# Patient Record
Sex: Female | Born: 1975 | Race: White | Hispanic: No | Marital: Single | Smoking: Never smoker
Health system: Southern US, Community
[De-identification: ages and names within clinical notes are randomized; demographics above are authoritative.]

## PROBLEM LIST (undated history)

## (undated) DIAGNOSIS — I10 Essential (primary) hypertension: Secondary | ICD-10-CM

## (undated) DIAGNOSIS — E079 Disorder of thyroid, unspecified: Secondary | ICD-10-CM

---

## 2011-11-23 ENCOUNTER — Other Ambulatory Visit: Payer: Self-pay | Admitting: Family Medicine

## 2011-11-23 DIAGNOSIS — R109 Unspecified abdominal pain: Secondary | ICD-10-CM

## 2011-11-24 ENCOUNTER — Ambulatory Visit
Admission: RE | Admit: 2011-11-24 | Discharge: 2011-11-24 | Disposition: A | Discharge status: Home / Self Care | Payer: No Typology Code available for payment source | Source: Ambulatory Visit | Attending: Family Medicine | Admitting: Family Medicine

## 2011-11-24 DIAGNOSIS — R109 Unspecified abdominal pain: Secondary | ICD-10-CM

## 2012-09-11 ENCOUNTER — Ambulatory Visit: Payer: Self-pay

## 2013-09-11 ENCOUNTER — Emergency Department (HOSPITAL_COMMUNITY)
Admission: EM | Admit: 2013-09-11 | Discharge: 2013-09-11 | Disposition: A | Discharge status: Home / Self Care | Payer: BC Managed Care – PPO | Attending: Emergency Medicine | Admitting: Emergency Medicine

## 2013-09-11 ENCOUNTER — Emergency Department (HOSPITAL_COMMUNITY): Payer: BC Managed Care – PPO

## 2013-09-11 ENCOUNTER — Encounter (HOSPITAL_COMMUNITY): Payer: Self-pay | Admitting: Emergency Medicine

## 2013-09-11 DIAGNOSIS — Z7982 Long term (current) use of aspirin: Secondary | ICD-10-CM | POA: Insufficient documentation

## 2013-09-11 DIAGNOSIS — S161XXA Strain of muscle, fascia and tendon at neck level, initial encounter: Secondary | ICD-10-CM

## 2013-09-11 DIAGNOSIS — S139XXA Sprain of joints and ligaments of unspecified parts of neck, initial encounter: Secondary | ICD-10-CM | POA: Insufficient documentation

## 2013-09-11 DIAGNOSIS — W1692XA Jumping or diving into unspecified water causing other injury, initial encounter: Secondary | ICD-10-CM | POA: Insufficient documentation

## 2013-09-11 DIAGNOSIS — Z79899 Other long term (current) drug therapy: Secondary | ICD-10-CM | POA: Insufficient documentation

## 2013-09-11 DIAGNOSIS — I10 Essential (primary) hypertension: Secondary | ICD-10-CM | POA: Insufficient documentation

## 2013-09-11 DIAGNOSIS — S060X9A Concussion with loss of consciousness of unspecified duration, initial encounter: Secondary | ICD-10-CM | POA: Insufficient documentation

## 2013-09-11 DIAGNOSIS — S0990XA Unspecified injury of head, initial encounter: Secondary | ICD-10-CM

## 2013-09-11 DIAGNOSIS — S060XAA Concussion with loss of consciousness status unknown, initial encounter: Secondary | ICD-10-CM | POA: Insufficient documentation

## 2013-09-11 DIAGNOSIS — Y9289 Other specified places as the place of occurrence of the external cause: Secondary | ICD-10-CM | POA: Insufficient documentation

## 2013-09-11 DIAGNOSIS — E079 Disorder of thyroid, unspecified: Secondary | ICD-10-CM | POA: Insufficient documentation

## 2013-09-11 DIAGNOSIS — Y9339 Activity, other involving climbing, rappelling and jumping off: Secondary | ICD-10-CM | POA: Insufficient documentation

## 2013-09-11 HISTORY — DX: Disorder of thyroid, unspecified: E07.9

## 2013-09-11 HISTORY — DX: Essential (primary) hypertension: I10

## 2013-09-11 MED ORDER — OXYCODONE-ACETAMINOPHEN 5-325 MG PO TABS: 1.0000 | ORAL_TABLET | ORAL | Status: AC | PRN

## 2013-09-11 MED ORDER — KETOROLAC TROMETHAMINE 60 MG/2ML IM SOLN
60.0000 mg | Freq: Once | INTRAMUSCULAR | Status: AC
  Administered 2013-09-11: 60 mg via INTRAMUSCULAR
  Filled 2013-09-11: qty 2

## 2013-09-11 MED ORDER — CYCLOBENZAPRINE HCL 10 MG PO TABS: 10.0000 mg | ORAL_TABLET | Freq: Three times a day (TID) | ORAL | Status: AC | PRN

## 2013-09-11 NOTE — Discharge Instructions (Signed)
Concussion, Adult °A concussion, or closed-head injury, is a brain injury caused by a direct blow to the head or by a quick and sudden movement (jolt) of the head or neck. Concussions are usually not life-threatening. Even so, the effects of a concussion can be serious. If you have had a concussion before, you are more likely to experience concussion-like symptoms after a direct blow to the head.  °CAUSES  °· Direct blow to the head, such as from running into another player during a soccer game, being hit in a fight, or hitting your head on a hard surface. °· A jolt of the head or neck that causes the brain to move back and forth inside the skull, such as in a car crash. °SIGNS AND SYMPTOMS  °The signs of a concussion can be hard to notice. Early on, they may be missed by you, family members, and health care providers. You may look fine but act or feel differently. °Symptoms are usually temporary, but they may last for days, weeks, or even longer. Some symptoms may appear right away while others may not show up for hours or days. Every head injury is different. Symptoms include:  °· Mild to moderate headaches that will not go away. °· A feeling of pressure inside your head.  °· Having more trouble than usual:   °· Learning or remembering things you have heard. °· Answering questions.  °· Paying attention or concentrating.   °· Organizing daily tasks.   °· Making decisions and solving problems.   °· Slowness in thinking, acting or reacting, speaking, or reading.   °· Getting lost or being easily confused.   °· Feeling tired all the time or lacking energy (fatigued).   °· Feeling drowsy.   °· Sleep disturbances.   °· Sleeping more than usual.   °· Sleeping less than usual.   °· Trouble falling asleep.   °· Trouble sleeping (insomnia).   °· Loss of balance or feeling lightheaded or dizzy.   °· Nausea or vomiting.   °· Numbness or tingling.   °· Increased sensitivity to:   °· Sounds.   °· Lights.   °· Distractions.    °· Vision problems or eyes that tire easily.   °· Diminished sense of taste or smell.   °· Ringing in the ears.   °· Mood changes such as feeling sad or anxious.   °· Becoming easily irritated or angry for little or no reason.   °· Lack of motivation. °· Seeing or hearing things other people do not see or hear (hallucinations). °DIAGNOSIS  °Your health care provider can usually diagnose a concussion based on a description of your injury and symptoms. He or she will ask whether you passed out (lost consciousness) and whether you are having trouble remembering events that happened right before and during your injury.  °Your evaluation might include:  °· A brain scan to look for signs of injury to the brain. Even if the test shows no injury, you may still have a concussion.   °· Blood tests to be sure other problems are not present. °TREATMENT  °· Concussions are usually treated in an emergency department, in urgent care, or at a clinic. You may need to stay in the hospital overnight for further treatment.   °· Tell your health care provider if you are taking any medicines, including prescription medicines, over-the-counter medicines, and natural remedies. Some medicines, such as blood thinners (anticoagulants) and aspirin, may increase the chance of complications. Also tell your health care provider whether you have had alcohol or are taking illegal drugs. This information may affect treatment. °· Your health care provider will send you   home with important instructions to follow. °· How fast you will recover from a concussion depends on many factors. These factors include how severe your concussion is, what part of your brain was injured, your age, and how healthy you were before the concussion. °· Most people with mild injuries recover fully. Recovery can take time. In general, recovery is slower in older persons. Also, persons who have had a concussion in the past or have other medical problems may find that it  takes longer to recover from their current injury. °HOME CARE INSTRUCTIONS  °General Instructions °· Carefully follow the directions your health care provider gave you. °· Only take over-the-counter or prescription medicines for pain, discomfort, or fever as directed by your health care provider. °· Take only those medicines that your health care provider has approved. °· Do not drink alcohol until your health care provider says you are well enough to do so. Alcohol and certain other drugs may slow your recovery and can put you at risk of further injury. °· If it is harder than usual to remember things, write them down. °· If you are easily distracted, try to do one thing at a time. For example, do not try to watch TV while fixing dinner. °· Talk with family members or close friends when making important decisions. °· Keep all follow-up appointments. Repeated evaluation of your symptoms is recommended for your recovery. °· Watch your symptoms and tell others to do the same. Complications sometimes occur after a concussion. Older adults with a brain injury may have a higher risk of serious complications such as of a blood clot on the brain. °· Tell your teachers, school nurse, school counselor, coach, athletic trainer, or work manager about your injury, symptoms, and restrictions. Tell them about what you can or cannot do. They should watch for:   °· Increased problems with attention or concentration.   °· Increased difficulty remembering or learning [place] information.   °· Increased time needed to complete tasks or assignments.   °· Increased irritability or decreased ability to cope with stress.   °· Increased symptoms.   °· Rest. Rest helps the brain to heal. Make sure you: °· Get plenty of sleep at night. Avoid staying up late at night. °· Keep the same bedtime hours on weekends and weekdays. °· Rest during the day. Take daytime naps or rest breaks when you feel tired. °· Limit activities that require a lot of  thought or concentration. These includes   °· Doing homework or job-related work.   °· Watching TV.   °· Working on the computer. °· Avoid any situation where there is potential for another head injury (football, hockey, soccer, basketball, martial arts, downhill snow sports and horseback riding). Your condition will get worse every time you experience a concussion. You should avoid these activities until you are evaluated by the appropriate follow-up caregivers. °Returning To Your Regular Activities °You will need to return to your normal activities slowly, not all at once. You must give your body and brain enough time for recovery. °· Do not return to sports or other athletic activities until your health care provider tells you it is safe to do so. °· Ask your health care provider when you can drive, ride a bicycle, or operate heavy machinery. Your ability to react may be slower after a brain injury. Never do these activities if you are dizzy. °· Ask your health care provider about when you can return to work or school. °Preventing Another Concussion °It is very important to avoid another   brain injury, especially before you have recovered. In rare cases, another injury can lead to permanent brain damage, brain swelling, or death. The risk of this is greatest during the first 7 10 days after a head injury. Avoid injuries by:   Wearing a seat belt when riding in a car.   Drinking alcohol only in moderation.   Wearing a helmet when biking, skiing, skateboarding, skating, or doing similar activities.  Avoiding activities that could lead to a second concussion, such as contact or recreational sports, until your health care provider says it is OK.  Taking safety measures in your home.   Remove clutter and tripping hazards from floors and stairways.   Use grab bars in bathrooms and handrails by stairs.   Place non-slip mats on floors and in bathtubs.   Improve lighting in dim areas. SEEK MEDICAL  CARE IF:   You have increased problems paying attention or concentrating.   You have increased difficulty remembering or learning [place] information.   You need more time to complete tasks or assignments than before.   You have increased irritability or decreased ability to cope with stress.  You have more symptoms than before. Seek medical care if you have any of the following symptoms for more than 2 weeks after your injury:   Lasting (chronic) headaches.   Dizziness or balance problems.   Nausea.  Vision problems.   Increased sensitivity to noise or light.   Depression or mood swings.   Anxiety or irritability.   Memory problems.   Difficulty concentrating or paying attention.   Sleep problems.   Feeling tired all the time. SEEK IMMEDIATE MEDICAL CARE IF:   You have severe or worsening headaches. These may be a sign of a blood clot in the brain.  You have weakness (even if only in one hand, leg, or part of the face).  You have numbness.  You have decreased coordination.   You vomit repeatedly.  You have increased sleepiness.  One pupil is larger than the other.   You have convulsions.   You have slurred speech.   You have increased confusion. This may be a sign of a blood clot in the brain.  You have increased restlessness, agitation, or irritability.   You are unable to recognize people or places.   You have neck pain.   It is difficult to wake you up.   You have unusual behavior changes.   You lose consciousness. MAKE SURE YOU:   Understand these instructions.  Will watch your condition.  Will get help right away if you are not doing well or get worse. Document Released: 06/19/2003 Document Revised: 11/29/2012 Document Reviewed: 10/19/2012 [facility] Patient Information 2014 [place], [place].  Cervical Sprain A cervical sprain is an injury in the neck in which the strong, fibrous tissues (ligaments) that connect your  neck bones stretch or tear. Cervical sprains can range from mild to severe. Severe cervical sprains can cause the neck vertebrae to be unstable. This can lead to damage of the spinal cord and can result in serious nervous system problems. The amount of time it takes for a cervical sprain to get better depends on the cause and extent of the injury. Most cervical sprains heal in 1 to 3 weeks. CAUSES  Severe cervical sprains may be caused by:   Contact sport injuries (such as from football, rugby, wrestling, hockey, auto racing, gymnastics, diving, martial arts, or boxing).   Motor vehicle collisions.   Whiplash injuries. This is  an injury from a sudden forward-and backward whipping movement of the head and neck.  Falls.  Mild cervical sprains may be caused by:   Being in an awkward position, such as while cradling a telephone between your ear and shoulder.   Sitting in a chair that does not offer proper support.   Working at a poorly [employer] executivedesigned computer station.   Looking up or down for long periods of time.  SYMPTOMS   Pain, soreness, stiffness, or a burning sensation in the front, back, or sides of the neck. This discomfort may develop immediately after the injury or slowly, 24 hours or more after the injury.   Pain or tenderness directly in the middle of the back of the neck.   Shoulder or upper back pain.   Limited ability to move the neck.   Headache.   Dizziness.   Weakness, numbness, or tingling in the hands or arms.   Muscle spasms.   Difficulty swallowing or chewing.   Tenderness and swelling of the neck.  DIAGNOSIS  Most of the time your health care provider can diagnose a cervical sprain by taking your history and doing a physical exam. Your health care provider will ask about previous neck injuries and any known neck problems, such as arthritis in the neck. X-rays may be taken to find out if there are any other problems, such as with the bones of  the neck. Other tests, such as a CT scan or MRI, may also be needed.  TREATMENT  Treatment depends on the severity of the cervical sprain. Mild sprains can be treated with rest, keeping the neck in place (immobilization), and pain medicines. Severe cervical sprains are immediately immobilized. Further treatment is done to help with pain, muscle spasms, and other symptoms and may include:  Medicines, such as pain relievers, numbing medicines, or muscle relaxants.   Physical therapy. This may involve stretching exercises, strengthening exercises, and posture training. Exercises and improved posture can help stabilize the neck, strengthen muscles, and help stop symptoms from returning.  HOME CARE INSTRUCTIONS   Put ice on the injured area.   Put ice in a plastic bag.   Place a towel between your skin and the bag.   Leave the ice on for 15 20 minutes, 3 4 times a day.   If your injury was severe, you may have been given a cervical collar to wear. A cervical collar is a two-piece collar designed to keep your neck from moving while it heals.  Do not remove the collar unless instructed by your health care provider.  If you have long hair, keep it outside of the collar.  Ask your health care provider before making any adjustments to your collar. Minor adjustments may be required over time to improve comfort and reduce pressure on your chin or on the back of your head.  Ifyou are allowed to remove the collar for cleaning or bathing, follow your health care provider's instructions on how to do so safely.  Keep your collar clean by wiping it with mild soap and water and drying it completely. If the collar you have been given includes removable pads, remove them every 1 2 days and hand wash them with soap and water. Allow them to air dry. They should be completely dry before you wear them in the collar.  If you are allowed to remove the collar for cleaning and bathing, wash and dry the skin  of your neck. Check your skin for irritation or  sores. If you see any, tell your health care provider.  Do not drive while wearing the collar.   Only take over-the-counter or prescription medicines for pain, discomfort, or fever as directed by your health care provider.   Keep all follow-up appointments as directed by your health care provider.   Keep all physical therapy appointments as directed by your health care provider.   Make any needed adjustments to your workstation to promote good posture.   Avoid positions and activities that make your symptoms worse.   Warm up and stretch before being active to help prevent problems.  SEEK MEDICAL CARE IF:   Your pain is not controlled with medicine.   You are unable to decrease your pain medicine over time as planned.   Your activity level is not improving as expected.  SEEK IMMEDIATE MEDICAL CARE IF:   You develop any bleeding.  You develop stomach upset.  You have signs of an allergic reaction to your medicine.   Your symptoms get worse.   You develop Fennel, unexplained symptoms.   You have numbness, tingling, weakness, or paralysis in any part of your body.  MAKE SURE YOU:   Understand these instructions.  Will watch your condition.  Will get help right away if you are not doing well or get worse. Document Released: 01/24/2007 Document Revised: 01/17/2013 Document Reviewed: 10/04/2012 [facility] Patient Information 2014 [place], [place].

## 2013-09-11 NOTE — ED Notes (Signed)
Pt reports going to the pool this weekend and did a back flip in the shallow end, hit her head on the bottom of pool. Now has pain, bruising, dizziness, nausea. No acute distress noted at triage.

## 2013-09-11 NOTE — ED Provider Notes (Signed)
CSN: [id number]     Arrival date & time 09/11/13  1315 History   First MD Initiated Contact with Patient 09/11/13 1519     Chief Complaint  Patient presents with  . Head Injury     (Consider location/radiation/quality/duration/timing/severity/associated sxs/prior Treatment) HPI Comments: Patient presents to the ER for evaluation of head injury. Patient reports she jumped into a pool 3 days ago hit her head on the bottom of the pool. Since then she has been having a constant headache across the top of her head. Pain is throbbing and moderate to severe. She reports associated dizziness and nausea. She says it feels like a "migraine". She has had migraines infrequently in the past. There is no visual disturbance. She also complains of pain on the left side of her neck. No radiation into the arms. No upper extremity numbness, tingling or weakness.  Patient is a 38 y.o. female presenting with head injury.  Head Injury Associated symptoms: headache and neck pain     Past Medical History  Diagnosis Date  . Hypertension   . Thyroid disease    History reviewed. No pertinent past surgical history. History reviewed. No pertinent family history. History  Substance Use Topics  . Smoking status: Not on file  . Smokeless tobacco: Not on file  . Alcohol Use: No   OB History   Grav Para Term Preterm Abortions TAB SAB Ect Mult Living                 Review of Systems  Musculoskeletal: Positive for neck pain.  Neurological: Positive for headaches.  All other systems reviewed and are negative.     Allergies  Bee venom; Codeine; and Other  Home Medications   Prior to Admission medications   Medication Sig Start Date End Date Taking? Authorizing Provider  aspirin 81 MG tablet Take 81 mg by mouth daily.   Yes Historical Provider, MD  metoprolol succinate (TOPROL-XL) 50 MG 24 hr tablet Take 50 mg by mouth daily. Take with or immediately following a meal.   Yes Historical Provider, MD    thyroid (ARMOUR) 30 MG tablet Take 30 mg by mouth daily before breakfast.   Yes Historical Provider, MD  cyclobenzaprine (FLEXERIL) 10 MG tablet Take 1 tablet (10 mg total) by mouth 3 (three) times daily as needed for muscle spasms. 09/11/13   Gilda Creasehristopher J. Pollina, MD  oxyCODONE-acetaminophen (PERCOCET) 5-325 MG per tablet Take 1-2 tablets by mouth every 4 (four) hours as needed. 09/11/13   Gilda Creasehristopher J. Pollina, MD   BP 149/73  Pulse 64  Temp(Src) 97.9 F (36.6 C) (Oral)  Resp 10  Wt 190 lb 8 oz (86.41 kg)  SpO2 100%  LMP 08/25/2013 Physical Exam  Constitutional: She is oriented to person, place, and time. She appears well-developed and well-nourished. No distress.  HENT:  Head: Normocephalic and atraumatic.  Right Ear: Hearing normal.  Left Ear: Hearing normal.  Nose: Nose normal.  Mouth/Throat: Oropharynx is clear and moist and mucous membranes are normal.  Eyes: Conjunctivae and EOM are normal. Pupils are equal, round, and reactive to light.  Neck: Normal range of motion. Neck supple. Muscular tenderness present.    Cardiovascular: Regular rhythm, S1 normal and S2 normal.  Exam reveals no gallop and no friction rub.   No murmur heard. Pulmonary/Chest: Effort normal and breath sounds normal. No respiratory distress. She exhibits no tenderness.  Abdominal: Soft. Normal appearance and bowel sounds are normal. There is no hepatosplenomegaly. There is no tenderness.  There is no rebound, no guarding, no tenderness at McBurney's point and negative Murphy's sign. No hernia.  Musculoskeletal: Normal range of motion.       Thoracic back: Normal.       Lumbar back: Normal.  Neurological: She is alert and oriented to person, place, and time. She has normal strength and normal reflexes. No cranial nerve deficit or sensory deficit. Coordination normal. GCS eye subscore is 4. GCS verbal subscore is 5. GCS motor subscore is 6.  Skin: Skin is warm, dry and intact. No rash noted. No cyanosis.   Psychiatric: She has a normal mood and affect. Her speech is normal and behavior is normal. Thought content normal.    ED Course  Procedures (including critical care time) Labs Review Labs Reviewed - No data to display  Imaging Review Ct Head Wo Contrast  09/11/2013   CLINICAL DATA:  Trauma this weekend.  Head and neck pain.  EXAM: CT HEAD WITHOUT CONTRAST  CT CERVICAL SPINE WITHOUT CONTRAST  TECHNIQUE: Multidetector CT imaging of the head and cervical spine was performed following the standard protocol without intravenous contrast. Multiplanar CT image reconstructions of the cervical spine were also generated.  COMPARISON:  None.  FINDINGS: CT HEAD FINDINGS  Sinuses/Soft tissues: No significant soft tissue swelling. Clear paranasal sinuses and mastoid air cells. No skull fracture.  Intracranial: No mass lesion, hemorrhage, hydrocephalus, acute infarct, intra-axial, or extra-axial fluid collection.  CT CERVICAL SPINE FINDINGS  Spinal visualization through the mid T1 level. Prevertebral soft tissues are within normal limits. No apical pneumothorax. Skull base intact. Maintenance of vertebral body height. Facets are well-aligned. Coronal reformats demonstrate a normal C1-C2 articulation.  IMPRESSION: 1.  No acute intracranial abnormality. 2. No acute fracture or subluxation in the cervical spine. 3. Straightening of expected cervical lordosis could be positional, due to muscular spasm, or ligamentous injury.   Electronically Signed   By: Jeronimo Greaves M.D.   On: 09/11/2013 16:49   Ct Cervical Spine Wo Contrast  09/11/2013   CLINICAL DATA:  Trauma this weekend.  Head and neck pain.  EXAM: CT HEAD WITHOUT CONTRAST  CT CERVICAL SPINE WITHOUT CONTRAST  TECHNIQUE: Multidetector CT imaging of the head and cervical spine was performed following the standard protocol without intravenous contrast. Multiplanar CT image reconstructions of the cervical spine were also generated.  COMPARISON:  None.  FINDINGS: CT HEAD  FINDINGS  Sinuses/Soft tissues: No significant soft tissue swelling. Clear paranasal sinuses and mastoid air cells. No skull fracture.  Intracranial: No mass lesion, hemorrhage, hydrocephalus, acute infarct, intra-axial, or extra-axial fluid collection.  CT CERVICAL SPINE FINDINGS  Spinal visualization through the mid T1 level. Prevertebral soft tissues are within normal limits. No apical pneumothorax. Skull base intact. Maintenance of vertebral body height. Facets are well-aligned. Coronal reformats demonstrate a normal C1-C2 articulation.  IMPRESSION: 1.  No acute intracranial abnormality. 2. No acute fracture or subluxation in the cervical spine. 3. Straightening of expected cervical lordosis could be positional, due to muscular spasm, or ligamentous injury.   Electronically Signed   By: Jeronimo Greaves M.D.   On: 09/11/2013 16:49     EKG Interpretation None      MDM   Final diagnoses:  Head injury  Concussion  Neck strain    Presents to the ER for evaluation of persistent headache after hitting her head on the bottom of the pool after diving in. She did not have any glottic dysfunction on examination. She has pain across the top of her  head as well as the left side of her neck. CT scan of head and cervical spine were performed, no evidence of intracranial injury and no cervical spine fracture or subluxation. Patient reassured, treated with analgesia, muscle relaxers, rest.    Gilda Crease, MD 09/13/13 [phone]

## 2017-05-02 ENCOUNTER — Ambulatory Visit (INDEPENDENT_AMBULATORY_CARE_PROVIDER_SITE_OTHER): Payer: Commercial Managed Care - PPO

## 2017-05-02 ENCOUNTER — Ambulatory Visit (INDEPENDENT_AMBULATORY_CARE_PROVIDER_SITE_OTHER): Payer: Commercial Managed Care - PPO | Admitting: Podiatry

## 2017-05-02 ENCOUNTER — Other Ambulatory Visit: Payer: Self-pay | Admitting: Podiatry

## 2017-05-02 DIAGNOSIS — T148XXA Other injury of unspecified body region, initial encounter: Secondary | ICD-10-CM

## 2017-05-02 DIAGNOSIS — M779 Enthesopathy, unspecified: Secondary | ICD-10-CM

## 2017-05-02 DIAGNOSIS — M2011 Hallux valgus (acquired), right foot: Secondary | ICD-10-CM | POA: Diagnosis not present

## 2017-05-02 DIAGNOSIS — M25579 Pain in unspecified ankle and joints of unspecified foot: Secondary | ICD-10-CM | POA: Diagnosis not present

## 2017-05-02 NOTE — Progress Notes (Signed)
Subjective:    Patient ID: Cheryl Luna, female    DOB: May 03, 1975, 42 y.o.   MRN: [id number]  HPI 42 year old female presents the office today with primary concern for right lateral ankle pain which is been ongoing for about 3 years.  This started after she fell twisting her ankle and she was told it was broken in 3 places.  At that time she was placed into a walking boot and she admits that she did not have good follow-up due to insurance.  She states that she had some intermittent pain in the last couple years but recently she started developing increasing pain that also aspect of the ankle and she followed up with Dr. Elijah Birkom.  He did an ultrasound which revealed a possible fracture to the fibula and she was placed into an Aircast.  She has had bracing as well as injections.  She also has a bunion that somewhat painful with certain shoes.  She states that when she was at Dr. Elijah Birkom they wanted to perform a bunion surgery before addressing the ankle but her primary concern of the ankle and should have a second opinion.  She states that she started noticing increasing pain also as per the right ankle after [place] [employer] [place] but no significant injury at that time.   Review of Systems  All other systems reviewed and are negative.  Past Medical History:  Diagnosis Date  . Hypertension   . Thyroid disease     No past surgical history on file.   Current Outpatient Medications:  .  aspirin 81 MG tablet, Take 81 mg by mouth daily., Disp: , Rfl:  .  cyclobenzaprine (FLEXERIL) 10 MG tablet, Take 1 tablet (10 mg total) by mouth 3 (three) times daily as needed for muscle spasms., Disp: 20 tablet, Rfl: 0 .  metoprolol succinate (TOPROL-XL) 50 MG 24 hr tablet, Take 50 mg by mouth daily. Take with or immediately following a meal., Disp: , Rfl:  .  oxyCODONE-acetaminophen (PERCOCET) 5-325 MG per tablet, Take 1-2 tablets by mouth every 4 (four) hours as needed.,  Disp: 20 tablet, Rfl: 0 .  thyroid (ARMOUR) 30 MG tablet, Take 30 mg by mouth daily before breakfast., Disp: , Rfl:   Allergies  Allergen Reactions  . Bee Venom   . Codeine Hives  . Other     Decongestants, milk, eggs    Social History   Socioeconomic History  . Marital status: Single    Spouse name: Not on file  . Number of children: Not on file  . Years of education: Not on file  . Highest education level: Not on file  Social Needs  . Financial resource strain: Not on file  . Food insecurity - worry: Not on file  . Food insecurity - inability: Not on file  . Transportation needs - medical: Not on file  . Transportation needs - non-medical: Not on file  Occupational History  . Not on file  Tobacco Use  . Smoking status: Not on file  Substance and Sexual Activity  . Alcohol use: No  . Drug use: No  . Sexual activity: Yes    Birth control/protection: None  Other Topics Concern  . Not on file  Social History Narrative  . Not on file        Objective:   Physical Exam General: AAO x3, NAD  Dermatological: Skin is warm, dry and supple bilateral. Nails x 10 are well  manicured; remaining integument appears unremarkable at this time. There are no open sores, no preulcerative lesions, no rash or signs of infection present.  Vascular: Dorsalis Pedis artery and Posterior Tibial artery pedal pulses are 2/4 bilateral with immedate capillary fill time. There is no pain with calf compression, swelling, warmth, erythema.   Neruologic: Grossly intact via light touch bilateral.  Protective threshold with Semmes Wienstein monofilament intact to all pedal sites bilateral.   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  No pain or crepitation with first MPJ range of motion.  No first ray hypomobility is present.  The majority of her symptoms though are to the lateral aspect the ankle and the course of the ATFL and  CFL.  There appears to be a slight increase in anterior drawer compared to the contralateral extremity with talar tilt test is negative.  There is no gross ankle instability present.  There is no area pinpoint tenderness or pain to vibratory sensation.  No pain with sinus tarsi.  No pain with ankle or subtalar joint range of motion.  Achilles tendon intact.  Muscular strength 5/5 in all groups tested bilateral.  Gait: Unassisted, Nonantalgic.     Assessment & Plan:  42 year old female with right chronic lateral ankle pain; right HAV -Treatment options discussed including all alternatives, risks, and complications -Etiology of symptoms were discussed -X-rays were obtained and reviewed with the patient.  No definitive evidence of acute fracture identified today however there is a changes in the distal fibula which may represent an old fracture.  Ankle joint maintained. -At this time given her ongoing symptoms I recommend an MRI of the ankle to evaluate the integrity of the lateral ankle ligaments and is for potential surgical planning.  We discussed treatment options but will await the results of the MRI.  She agrees this.  In the meantime continue with the brace. -In regards to the bunion surgery I think that adjustment ankle be more beneficial for her as this is where her primary concerns are.  I do not need to have bunion surgery first to improve her ankle.  Vivi Barrack DPM

## 2017-05-03 ENCOUNTER — Telehealth: Payer: Self-pay | Admitting: *Deleted

## 2017-05-03 DIAGNOSIS — T148XXA Other injury of unspecified body region, initial encounter: Secondary | ICD-10-CM

## 2017-05-03 NOTE — Telephone Encounter (Signed)
Orders to J. Quintana, RN for pre-cert and faxed to [facility]. 

## 2017-05-03 NOTE — Telephone Encounter (Signed)
-----   Message from Vivi BarrackMatthew R Wagoner, DPM sent at 05/03/2017  7:01 AM EST ----- Can you please order an MRI of the right ankle to rule out lateral ankle ligament tear?  Thank you

## 2017-05-05 ENCOUNTER — Other Ambulatory Visit: Payer: Self-pay | Admitting: Physician Assistant

## 2017-05-05 ENCOUNTER — Other Ambulatory Visit (HOSPITAL_COMMUNITY)
Admission: RE | Admit: 2017-05-05 | Discharge: 2017-05-05 | Disposition: A | Discharge status: Home / Self Care | Payer: Commercial Managed Care - PPO | Source: Ambulatory Visit | Attending: Physician Assistant | Admitting: Physician Assistant

## 2017-05-05 DIAGNOSIS — Z0000 Encounter for general adult medical examination without abnormal findings: Secondary | ICD-10-CM | POA: Diagnosis not present

## 2017-05-09 ENCOUNTER — Telehealth: Payer: Self-pay | Admitting: *Deleted

## 2017-05-09 NOTE — Telephone Encounter (Signed)
Cheryl CrockerJessica Luna got the authorization for the [facility] from [facility].  [facility] is aware.

## 2017-05-09 NOTE — Telephone Encounter (Signed)
She's scheduled for a MRI on tomorrow.  It hasn't been authorized.  The phone number to [employer] is [phone], select option 2.

## 2017-05-10 ENCOUNTER — Ambulatory Visit
Admission: RE | Admit: 2017-05-10 | Discharge: 2017-05-10 | Disposition: A | Discharge status: Home / Self Care | Payer: Commercial Managed Care - PPO | Source: Ambulatory Visit | Attending: Podiatry | Admitting: Podiatry

## 2017-05-10 DIAGNOSIS — T148XXA Other injury of unspecified body region, initial encounter: Secondary | ICD-10-CM

## 2017-05-11 ENCOUNTER — Telehealth: Payer: Self-pay | Admitting: Podiatry

## 2017-05-11 ENCOUNTER — Other Ambulatory Visit: Payer: Self-pay | Admitting: Podiatry

## 2017-05-11 LAB — CYTOLOGY - PAP
Adequacy: ABSENT
Diagnosis: NEGATIVE
HPV: NOT DETECTED

## 2017-05-11 NOTE — Telephone Encounter (Signed)
Left vm for pt to call office to schedule surgical consultation with Dr. Ardelle AntonWagoner.

## 2017-05-23 ENCOUNTER — Ambulatory Visit (INDEPENDENT_AMBULATORY_CARE_PROVIDER_SITE_OTHER): Payer: Commercial Managed Care - PPO

## 2017-05-23 ENCOUNTER — Ambulatory Visit (INDEPENDENT_AMBULATORY_CARE_PROVIDER_SITE_OTHER): Payer: Commercial Managed Care - PPO | Admitting: Podiatry

## 2017-05-23 ENCOUNTER — Encounter: Payer: Self-pay | Admitting: Podiatry

## 2017-05-23 DIAGNOSIS — T148XXA Other injury of unspecified body region, initial encounter: Secondary | ICD-10-CM

## 2017-05-23 DIAGNOSIS — M21619 Bunion of unspecified foot: Secondary | ICD-10-CM

## 2017-05-23 DIAGNOSIS — M25579 Pain in unspecified ankle and joints of unspecified foot: Secondary | ICD-10-CM

## 2017-05-23 NOTE — Patient Instructions (Signed)

## 2017-05-24 ENCOUNTER — Encounter: Payer: Self-pay | Admitting: Podiatry

## 2017-05-24 ENCOUNTER — Telehealth: Payer: Self-pay | Admitting: *Deleted

## 2017-05-24 NOTE — Telephone Encounter (Signed)
"  I'm having surgery with Dr. Ardelle AntonWagoner on Wednesday, February 20.  I got an email that said I needed to be there at 6:15 am.  I want to talk to someone about that time if I could."  I called her and asked her to call the surgical center regarding the change in time.  I gave her [facility] phone number, [phone].

## 2017-05-31 NOTE — Progress Notes (Signed)
Subjective: 42 year old female presents the office today for surgical consultation due to pain which is been coming to the ankle as of the right ankle as well as for painful bunion deformity.  She also presents to discuss MRI results pathology call her with results as well.  She has had ongoing pain in the also aspect of the ankle for about 3 years after an injury.  She did have immobilization for some time and she has been bracing as well.  At this point she wished to proceed with surgical intervention for this as well as for painful bunion is also been ongoing and is painful shoes on a daily basis.  She is tried offloading and padding without any significant improvement. Denies any systemic complaints such as fevers, chills, nausea, vomiting. No acute changes since last appointment, and no other complaints at this time.   Objective: AAO x3, NAD DP/PT pulses palpable bilaterally, CRT less than 3 seconds There is continuation of tenderness to the lateral aspect of the right ankle and this is along the course of the ATFL movement and CFL.  There is no pain to the PT FL.  There is no significant gross instability of the ankle joint.  No pain in the syndesmosis, deltoid ligaments, Achilles tendon.  Mild increase in anterior drawer test.  The contralateral extremity however talar tilt test is negative.  There is no area pinpoint tenderness to the ankle.  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  There is tenderness of the on the bunion site.  No significant discomfort at the calcaneocuboid joint.  No pain along the posterior tibial tendon. No open lesions or pre-ulcerative lesions.  No pain with calf compression, swelling, warmth, erythema  MRI 05/10/2017 IMPRESSION: 1. 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 2. Poor definition of the anterior talofibular ligament  consistent with prior injury. 3. Minimal tenosynovitis of posterior tibialis tendon.  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  -Again discussed MRI results.  See above. -We discussed both conservative as well as surgical treatment options.  At this point she wishes to proceed with surgical treatment.  We discussed ATFL repair as well as Austin bunionectomy with screw fixation.  We discussed the surgery as well as the postoperative course.  After discussion she wished to proceed with surgery. -The incision placement as well as the postoperative course was discussed with the patient. I discussed risks of the surgery which include, but not limited to, infection, bleeding, pain, swelling, need for further surgery, delayed or nonhealing, painful or ugly scar, numbness or sensation changes, over/under correction, recurrence, transfer lesions, further deformity, hardware failure, DVT/PE, loss of toe/foot. Patient understands these risks and wishes to proceed with surgery. The surgical consent was reviewed with the patient all 3 pages were signed. No promises or guarantees were given to the outcome of the procedure. All questions were answered to the best of my ability. Before the surgery the patient was encouraged to call the office if there is any further questions. [facility] an outpatient basis. -Patient encouraged to call the office with any questions, concerns, change in symptoms.   Vivi BarrackMatthew R Wagoner DPM   Cc: Dr. Elias Elseobert Reade

## 2017-06-01 ENCOUNTER — Encounter: Payer: Self-pay | Admitting: Podiatry

## 2017-06-01 DIAGNOSIS — M2011 Hallux valgus (acquired), right foot: Secondary | ICD-10-CM | POA: Diagnosis not present

## 2017-06-01 DIAGNOSIS — T148XXA Other injury of unspecified body region, initial encounter: Secondary | ICD-10-CM | POA: Diagnosis not present

## 2017-06-01 NOTE — Progress Notes (Signed)
Pre-operative Note  Patient [facility] to [facility] today for surgical intervention of the RIGHT ankle/foot for lateral ankle ligament repair and bunion correction with cast. The surgical consent was reviewed with the patient and we discussed the procedure as well as the postoperative course. I again discussed all alternatives, risks, complications. I answered all of their questions to the best of my ability and they wish to proceed with surgery. No promises or guarantees were given as to the outcome of the surgery.   The surgical consent was signed.   Patient is NPO since midnight.  The patient does not have have a history of blood clots or bleeding disorders.   Her mom/family is in the facility but I did not get to speak with her before surgery. Gave permission to talk to her after the surgery.   No further questions.   Ovid CurdMatthew Wagoner, DPM [facility]

## 2017-06-03 ENCOUNTER — Telehealth: Payer: Self-pay | Admitting: *Deleted

## 2017-06-03 NOTE — Telephone Encounter (Signed)
Spoke with the patient today and patient states that she is feeling fine and the nerve block has worn off and is only taken 1/2 tablet of the pain medicine and mainly taken the ibuprofen and the pain scale is an 8 and no fever, chills, nausea and I stated to the patient to call the office if any concerns or questions. Misty StanleyLisa

## 2017-06-06 ENCOUNTER — Encounter: Payer: Self-pay | Admitting: Podiatry

## 2017-06-06 ENCOUNTER — Ambulatory Visit: Payer: Commercial Managed Care - PPO

## 2017-06-06 ENCOUNTER — Ambulatory Visit (INDEPENDENT_AMBULATORY_CARE_PROVIDER_SITE_OTHER): Payer: Commercial Managed Care - PPO | Admitting: Podiatry

## 2017-06-06 ENCOUNTER — Ambulatory Visit (INDEPENDENT_AMBULATORY_CARE_PROVIDER_SITE_OTHER): Payer: Commercial Managed Care - PPO

## 2017-06-06 VITALS — BP 137/85 | HR 67 | Temp 99.2°F | Resp 18

## 2017-06-06 DIAGNOSIS — T148XXA Other injury of unspecified body region, initial encounter: Secondary | ICD-10-CM

## 2017-06-06 DIAGNOSIS — M2011 Hallux valgus (acquired), right foot: Secondary | ICD-10-CM | POA: Diagnosis not present

## 2017-06-06 DIAGNOSIS — M21619 Bunion of unspecified foot: Secondary | ICD-10-CM | POA: Diagnosis not present

## 2017-06-08 ENCOUNTER — Encounter: Payer: Self-pay | Admitting: Podiatry

## 2017-06-08 NOTE — Progress Notes (Signed)
Subjective: Cheryl Luna is a 42 y.o. is seen today in office s/p right ATFL ligament repair as well as Cheryl Luna bunionectomy preformed on 06/01/2017. They state their pain is improved and she has not been taking much pain medication.  She states that she is been tracking her foot ice and elevate as much as possible.  She is been taking antibiotics as directed.  Overall she states that she feels great and has she has no concerns today.. Denies any systemic complaints such as fevers, chills, nausea, vomiting. No calf pain, chest pain, shortness of breath.   Objective: General: No acute distress, AAOx3  DP/PT pulses palpable 2/4, CRT < 3 sec to all digits.  Protective sensation intact. Motor function intact.  Right  foot: Cast is clean, dry, intact.  Incision is well coapted without any evidence of dehiscence with sutures, staples intact. There is no surrounding erythema, ascending cellulitis, fluctuance, crepitus, malodor, drainage/purulence. There is mild edema around the surgical site. There is minimal  pain along the surgical site.  Hallux is in rectus position No other areas of tenderness to bilateral lower extremities.  No other open lesions or pre-ulcerative lesions.  No pain with calf compression, swelling, warmth, erythema.   Assessment and Plan:  Status post right foot surgery, doing well with no complications   -Treatment options discussed including all alternatives, risks, and complications -X-rays were obtained and reviewed.  Hardware intact the first metatarsal.  Status post osteotomy of the first metatarsal but no evidence of acute fracture identified otherwise. -I agree with the cast today to evaluate the wound which appears to be doing well.  Antibiotic ointment was applied followed by a dry sterile dressing.  A well-padded below-knee fiberglass cast was applied to consider to pad all bony prominences.  -Ice/elevation -Pain medication as needed. -Monitor for any clinical signs or  symptoms of infection and DVT/PE and directed to call the office immediately should any occur or go to the ER. -Follow-up in 10 days for cast change and possible suture removal or sooner if any problems arise. In the meantime, encouraged to call the office with any questions, concerns, change in symptoms.   Ovid CurdMatthew Wagoner, DPM

## 2017-06-09 ENCOUNTER — Ambulatory Visit (INDEPENDENT_AMBULATORY_CARE_PROVIDER_SITE_OTHER): Payer: Commercial Managed Care - PPO

## 2017-06-09 ENCOUNTER — Ambulatory Visit (INDEPENDENT_AMBULATORY_CARE_PROVIDER_SITE_OTHER): Payer: Commercial Managed Care - PPO | Admitting: Podiatry

## 2017-06-09 DIAGNOSIS — W19XXXA Unspecified fall, initial encounter: Secondary | ICD-10-CM

## 2017-06-09 DIAGNOSIS — M2011 Hallux valgus (acquired), right foot: Secondary | ICD-10-CM

## 2017-06-09 DIAGNOSIS — T148XXA Other injury of unspecified body region, initial encounter: Secondary | ICD-10-CM

## 2017-06-09 NOTE — Progress Notes (Signed)
Subjective: Cheryl Luna is a 42 y.o. is seen today in office s/p right ATFL ligament repair as well as [name] bunionectomy preformed on 06/01/2017.  She presents today as she did fall.  She states that when she fell she just put weight on her foot but denies falling turning her leg.  Since then she had some increase in swelling and pain and she presents today for cast removal as the cast was tight.  She states that the pain is improved and she is no longer taking any pain medication.  She denies any other injury at the time of the fall.  Denies any systemic complaints such as fevers, chills, nausea, vomiting. No calf pain, chest pain, shortness of breath.   Objective: General: No acute distress, AAOx3  DP/PT pulses palpable 2/4, CRT < 3 sec to all digits.  Protective sensation intact. Motor function intact.  Right  foot: Cast is clean, dry, intact.  Incision is well coapted without any evidence of dehiscence with sutures, staples intact.  There is one staple on the lateral ankle incision which appears to be almost removed and there is mild increase in swelling to the lateral aspect the ankle and tenderness palpation directly on the ankle.  There is no tenderness on the course of the bunion and the toe remains in rectus position.  There is no erythema or increase in warmth.  There is no clinical signs of infection present.  No drainage or pus coming from the incision.  There is a small amount of bloody drainage on the bandage.  There is no active bleeding present.   No other open lesions or pre-ulcerative lesions.  No pain with calf compression, swelling, warmth, erythema.   Assessment and Plan:  Status post right foot surgery, doing well with no complications   -Treatment options discussed including all alternatives, risks, and complications -X-rays were obtained and reviewed.  Hardware intact the first metatarsal.  Status post osteotomy of the first metatarsal but no evidence of acute fracture  identified otherwise.  No evidence of acute fracture -I did remove the cast today to evaluate the incision.  Antibiotic ointment was applied followed by a bandage.  Cam boot was applied today that she brought with her. -Continue ice and elevation -Pain medication as needed -Monitor for any clinical signs or symptoms of infection and DVT/PE and directed to call the office immediately should any occur or go to the ER. -Follow-up in as scheduled for possible suture removal or sooner if any problems arise. In the meantime, encouraged to call the office with any questions, concerns, change in symptoms.   Ovid CurdMatthew Wagoner, DPM

## 2017-06-13 ENCOUNTER — Encounter: Payer: Commercial Managed Care - PPO | Admitting: Podiatry

## 2017-06-17 ENCOUNTER — Encounter: Payer: Self-pay | Admitting: Podiatry

## 2017-06-17 ENCOUNTER — Ambulatory Visit (INDEPENDENT_AMBULATORY_CARE_PROVIDER_SITE_OTHER): Payer: Commercial Managed Care - PPO | Admitting: Podiatry

## 2017-06-17 DIAGNOSIS — T148XXA Other injury of unspecified body region, initial encounter: Secondary | ICD-10-CM

## 2017-06-17 DIAGNOSIS — M2011 Hallux valgus (acquired), right foot: Secondary | ICD-10-CM

## 2017-06-19 NOTE — Progress Notes (Signed)
Subjective: Cheryl Luna is a 42 y.o. is seen today in office s/p right ATFL ligament repair as well as Cheryl Luna bunionectomy preformed on 06/01/2017. She states that she is doing well. She does state that she works 5 hours per day and at the end she notices that he foot is swollen. She reports her pain has improved. She denies any recent injury/truama. Overall she feels well and she denies any systemic complaints such as fevers, chills, nausea, vomiting. No calf pain, chest pain, shortness of breath.   Objective: General: No acute distress, AAOx3  DP/PT pulses palpable 2/4, CRT < 3 sec to all digits.  Protective sensation intact. Motor function intact.  Right  foot:  Incision is well coapted without any evidence of dehiscence with sutures, staples intact.  There is minimal discomfort along the lateral ankle incision.  There is no tenderness on the course of the bunion and the toe remains in rectus position.  There is no erythema or increase in warmth.  There is no clinical signs of infection present.  No drainage or pus coming from the incision.  Overall she appears to be doing well the incisions are healing well. No other open lesions or pre-ulcerative lesions.  No pain with calf compression, swelling, warmth, erythema.   Assessment and Plan:  Status post right foot surgery, doing well with no complications   -Treatment options discussed including all alternatives, risks, and complications -Today the sutures removed to the lateral ankle incision as well as one staple that was not within the incision.  After removal incision remains well coapted.  The incision at the bunion is doing well and the suture ends were cut and again remained well coapted.  Steri-Strips were applied for reinforcement followed by antibiotic ointment and a bandage.  She can start to shower tomorrow and I want her to keep a similar bandage on. -Gentle range of motion exercises for both the bunion as well as the ankle.  Continue  cam boot and nonweightbearing at all times. -Pain medication as needed. -Ice and elevation -Monitor for any clinical signs or symptoms of infection and DVT/PE and directed to call the office immediately should any occur or go to the ER. -Follow-up in 2 weeks or sooner if any problems arise. In the meantime, encouraged to call the office with any questions, concerns, change in symptoms.   Ovid CurdMatthew Wagoner, DPM

## 2017-07-01 ENCOUNTER — Encounter: Payer: Self-pay | Admitting: Podiatry

## 2017-07-01 ENCOUNTER — Ambulatory Visit (INDEPENDENT_AMBULATORY_CARE_PROVIDER_SITE_OTHER): Payer: Commercial Managed Care - PPO | Admitting: Podiatry

## 2017-07-01 ENCOUNTER — Ambulatory Visit (INDEPENDENT_AMBULATORY_CARE_PROVIDER_SITE_OTHER): Payer: Commercial Managed Care - PPO

## 2017-07-01 DIAGNOSIS — T148XXA Other injury of unspecified body region, initial encounter: Secondary | ICD-10-CM

## 2017-07-01 DIAGNOSIS — M2011 Hallux valgus (acquired), right foot: Secondary | ICD-10-CM | POA: Diagnosis not present

## 2017-07-04 NOTE — Progress Notes (Signed)
Subjective: Cheryl Luna is a 42 y.o. is seen today in office s/p right ATFL ligament repair as well as Cheryl Luna bunionectomy preformed on 06/01/2017.  She presents today for follow-up evaluation as well as remove the remainder of the sutures to the lateral ankle incision.  She states that she is doing well overall she has been doing some gentle range of motion exercises.  He has no pain she denies any swelling or redness.  She states that cutting back to working 4 hours is been very helpful for her.  This is significantly improved her swelling.  She has no other concerns today she continues to deny any systemic complaints including fevers, chills, nausea, vomiting. No calf pain, chest pain, shortness of breath.   Objective: General: No acute distress, AAOx3  DP/PT pulses palpable 2/4, CRT < 3 sec to all digits.  Protective sensation intact. Motor function intact.  Right  foot:  Incision is well coapted without any evidence of dehiscence with staples intact to the lateral incision.  Both incisions appear to be healing well there is no motion across the incision and a scar has formed along the bunion site.  There is no surrounding erythema, ascending cellulitis.  There is no drainage or pus or any clinical signs of infection.  Mild edema to the surgical sites.  Mild decreased range of motion of the MPJ.  Mild discomfort on the surgical sites.  Ankle appears to be stable. No other open lesions or pre-ulcerative lesions.  No pain with calf compression, swelling, warmth, erythema.   Assessment and Plan:  Status post right foot surgery, doing well with no complications   -Treatment options discussed including all alternatives, risks, and complications -X-rays were obtained and reviewed.  There is no evidence of acute fracture.  Increased consolidation across the ostomy site of the bunion. -I remove the remainder of the staples the lateral ankle incision today.  Incision remained well coapted.  She can start  to shower tomorrow.  Antibiotic ointment and a bandage was applied. -Continue gentle range of motion exercises.  She can slowly start to become partial weightbearing in the cam boot and using crutches to put partial pressure on the foot.  Continue to ice and elevate.  I will see her back in 2 weeks and at that point we will likely start physical therapy become weightbearing in the cam boot.  Vivi BarrackMatthew R Luna DPM

## 2017-07-15 ENCOUNTER — Ambulatory Visit (INDEPENDENT_AMBULATORY_CARE_PROVIDER_SITE_OTHER): Payer: Commercial Managed Care - PPO

## 2017-07-15 ENCOUNTER — Ambulatory Visit (INDEPENDENT_AMBULATORY_CARE_PROVIDER_SITE_OTHER): Payer: Commercial Managed Care - PPO | Admitting: Podiatry

## 2017-07-15 DIAGNOSIS — T148XXA Other injury of unspecified body region, initial encounter: Secondary | ICD-10-CM

## 2017-07-15 DIAGNOSIS — M2011 Hallux valgus (acquired), right foot: Secondary | ICD-10-CM

## 2017-07-17 NOTE — Progress Notes (Signed)
Subjective: Cheryl Luna is a 42 y.o. is seen today in office s/p right ATFL ligament repair as well as [name] bunionectomy preformed on 06/01/2017.  She states that she is doing well.  She has been putting a little bit of weight on the surgical foot but still using crutches.  She states that the area feels tight.  She is not taking any pain medication.  She continues to work on a reduced rate.  She denies any recent injury or falls.  She has no Sane concerns today. Denies any systemic complaints including fevers, chills, nausea, vomiting. No calf pain, chest pain, shortness of breath.   Objective: General: No acute distress, AAOx3  DP/PT pulses palpable 2/4, CRT < 3 sec to all digits.  Protective sensation intact. Motor function intact.  Right  foot:  Incision is well coapted without any evidence of dehiscence.  Incisions will likely scab that had formed over the scar has come off.  Incision is coapted.  There is no surrounding erythema, ascending cellulitis.  There is no fluctuation or crepitation.  There is no malodor.  There is no clinical signs of infection.  Ankle appears to be stable.  There is improved range of motion of the first MTPJ there is no crepitation.  Minimal restriction of MPJ range of motion.  No significant discomfort to palpation of the surgical sites. No other open lesions or pre-ulcerative lesions.  No pain with calf compression, swelling, warmth, erythema.   Assessment and Plan:  Status post right foot surgery, doing well  -Treatment options discussed including all alternatives, risks, and complications -X-rays were obtained and reviewed.  There is no evidence of acute fracture and there is increased consolidation across the bunion osteotomy. -At this point I want her to continue range of motion exercises.  We will also start physical therapy.  Prescription for physical therapy was provided. -She can start to transition to weightbearing as tolerated in the cam boot.  Once is is  comfortable and she can be walking in the boot. -Continue to ice and elevate. -Continue with compression. -Continue cam boot until she is comfortable walking in the boot and we can transition to shoe as she progresses with physical therapy. -RTC 3 weeks or sooner if needed  Vivi BarrackMatthew R Wagoner DPM

## 2017-07-20 ENCOUNTER — Encounter: Payer: Self-pay | Admitting: Podiatry

## 2017-08-05 ENCOUNTER — Ambulatory Visit (INDEPENDENT_AMBULATORY_CARE_PROVIDER_SITE_OTHER): Payer: Commercial Managed Care - PPO | Admitting: Podiatry

## 2017-08-05 ENCOUNTER — Ambulatory Visit (INDEPENDENT_AMBULATORY_CARE_PROVIDER_SITE_OTHER): Payer: Commercial Managed Care - PPO

## 2017-08-05 ENCOUNTER — Encounter: Payer: Self-pay | Admitting: Podiatry

## 2017-08-05 DIAGNOSIS — T148XXA Other injury of unspecified body region, initial encounter: Secondary | ICD-10-CM | POA: Diagnosis not present

## 2017-08-05 DIAGNOSIS — M2011 Hallux valgus (acquired), right foot: Secondary | ICD-10-CM

## 2017-08-05 DIAGNOSIS — Z9889 Other specified postprocedural states: Secondary | ICD-10-CM

## 2017-08-07 NOTE — Progress Notes (Signed)
Subjective: Cheryl Luna is a 42 y.o. is seen today in office s/p right ATFL ligament repair as well as Cheryl Luna bunionectomy preformed on 06/01/2017.  She is been going to physical therapy and she presents today walking in the cam boot.  Overall she states that she is doing well.  Ankle feels stiff at times but overall her pain is much improved.  She states that he is interested in the chair prior to the surgery she was having pain but that has resolved.  The pain that she is getting now is different.  She denies any recent injury or trauma.  She has no Cheryl Luna concerns.  Overall she states that she is doing well and she denies any systemic complaints including fevers, chills, nausea, vomiting. No calf pain, chest pain, shortness of breath.   Objective: General: No acute distress, AAOx3  DP/PT pulses palpable 2/4, CRT < 3 sec to all digits.  Protective sensation intact. Motor function intact.  Right  foot:  Incision is well coapted without any evidence of dehiscence and scars are well formed.  Minimal discomfort in the surgical site to the ATFL stable.  There is no discomfort to palpation on the bunion site and there is improved range of motion of the first MPJ.  There is no crepitation with MPJ range of motion.  There is no other area tenderness identified at this time.  Minimal swelling to the surgical site there is no erythema or increase in warmth or any clinical signs of infection present. No other open lesions or pre-ulcerative lesions.  No pain with calf compression, swelling, warmth, erythema.   Assessment and Plan:  Status post right foot surgery, doing well  -Treatment options discussed including all alternatives, risks, and complications -X-rays were obtained and reviewed.  There is no evidence of acute fracture and there is increased consolidation across the bunion osteotomy.  Hardware intact. -Continue physical therapy.  Also dispensed a Tri-Lock ankle brace and she can slowly start to  transition to a regular shoe with a brace.  Continue ice and elevate.  Pain medication as needed but she has not been eating this.  She can use anti-inflammatories as needed.  Continue with compression as well to help with swelling -Follow-up in 3 to 4 weeks or sooner if needed.  Vivi Barrack DPM

## 2017-09-01 ENCOUNTER — Ambulatory Visit (INDEPENDENT_AMBULATORY_CARE_PROVIDER_SITE_OTHER): Payer: Commercial Managed Care - PPO | Admitting: Podiatry

## 2017-09-01 DIAGNOSIS — Z9889 Other specified postprocedural states: Secondary | ICD-10-CM

## 2017-09-01 DIAGNOSIS — T148XXA Other injury of unspecified body region, initial encounter: Secondary | ICD-10-CM

## 2017-09-01 DIAGNOSIS — M2011 Hallux valgus (acquired), right foot: Secondary | ICD-10-CM

## 2017-09-01 NOTE — Progress Notes (Signed)
Subjective: Cheryl Luna is a 42 y.o. is seen today in office s/p right ATFL ligament repair as well as Cheryl Luna bunionectomy preformed on 06/01/2017.  She states that she is doing well.  She was discharged from physical therapy last week.  She presents today wearing sandals.  She states that she will may be having occasional pain towards the end of the day to her ankle but otherwise she is doing well she has no pain.  She denies any recent injury or trauma she denies any increase in swelling.  She still wears a compression anklet  Objective: General: No acute distress, AAOx3  DP/PT pulses palpable 2/4, CRT < 3 sec to all digits.  Protective sensation intact. Motor function intact.  Right  foot:  Incision is well coapted without any evidence of dehiscence and scars are well formed.  Ankle appears to be stable.  There is no pain or restriction with MPJ range of motion her ankle joint range of motion.  There is no tenderness palpation of the surgical sites.  There is minimal edema there is no erythema or increase in warmth.  No other areas of tenderness identified today. No other open lesions or pre-ulcerative lesions.  No pain with calf compression, swelling, warmth, erythema.   Assessment and Plan:  Status post right foot surgery, doing well  -Treatment options discussed including all alternatives, risks, and complications -At this point she is back to working full-time she is wearing a regular shoe and she actually presents today wearing a sandal without any pain.  I want her to continue with home range of motion exercises.  Also continue to ice as well as gradually increase her activity.  Compression ankle to help with swelling.  She is having no pain.  This point will discharge her from the postoperative care however encouraged to call with any questions or concerns or any change in symptoms.  Vivi Barrack DPM

## 2017-09-16 ENCOUNTER — Encounter: Payer: Self-pay | Admitting: Podiatry

## 2018-12-19 IMAGING — MR MR ANKLE*R* W/O CM
4 of 6 series · 25 of 40 positions shown · non-contrast
Comparison: Radiographs dated 05/02/2017

CLINICAL DATA: Chronic lateral right ankle pain and weakness. Ankle
fracture in 2479.

EXAM:
MRI OF THE RIGHT ANKLE WITHOUT CONTRAST
TECHNIQUE: Multiplanar, multisequence MR imaging of the ankle was performed. No
intravenous contrast was administered.

[Series 3: PD fat-sat · axial · 3.5mm · 0.31mm/px · z∈[-9,+92]mm · 6 of 24 slices shown]
[im 1/24]
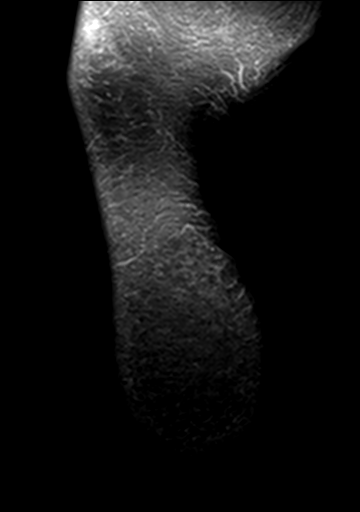
[im 5/24]
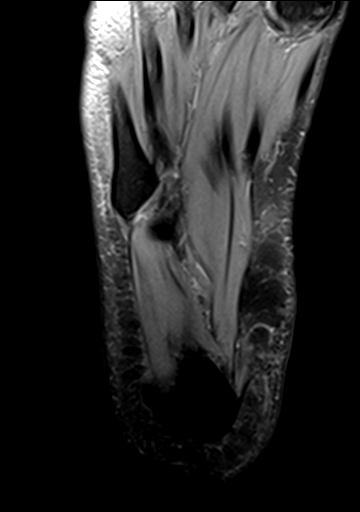
[im 10/24]
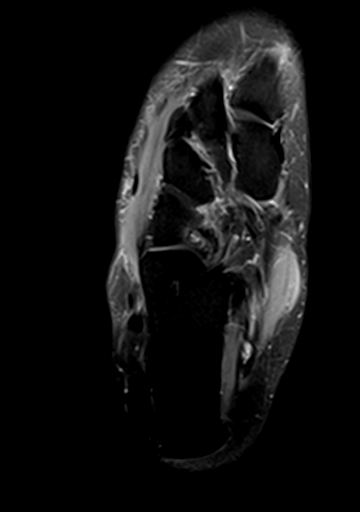
[im 14/24]
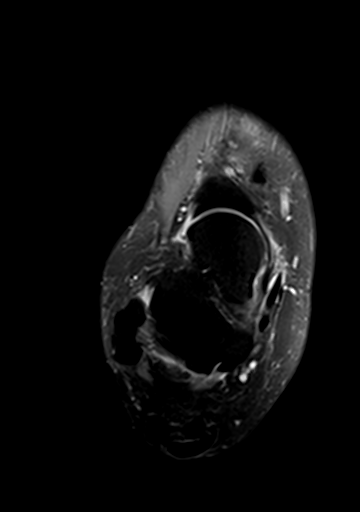
[im 19/24]
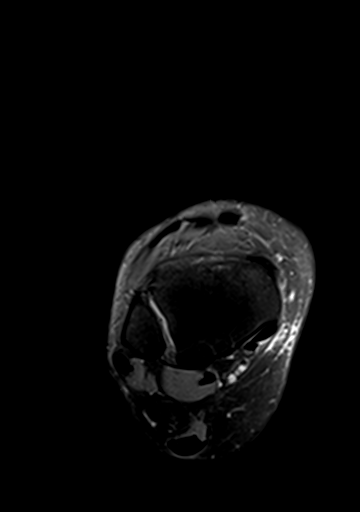
[im 24/24]
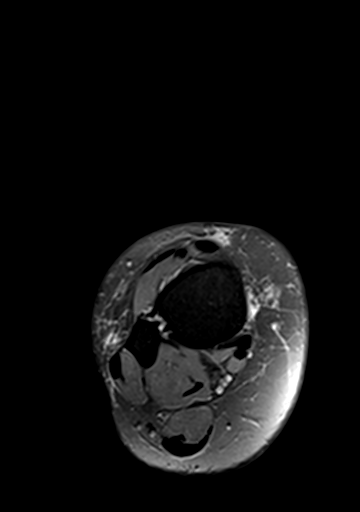

[Series 4: T2 fat-sat · axial · 3.5mm · 0.31mm/px · z∈[-9,+92]mm · 6 of 24 slices shown (1 of 3)]
[im 1/24]
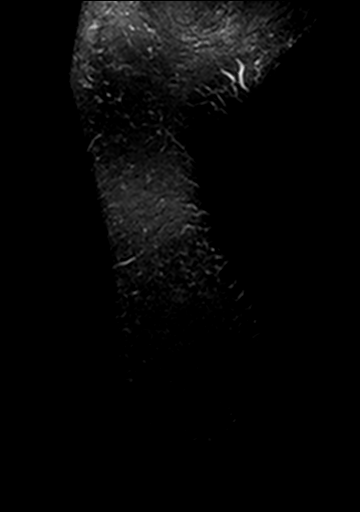
[im 5/24]
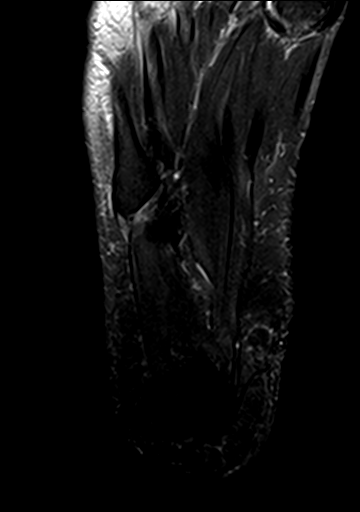
[im 10/24]
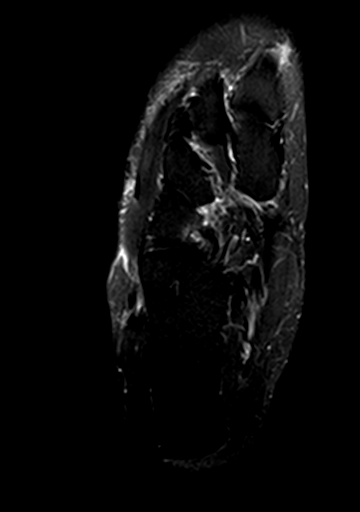
[im 14/24]
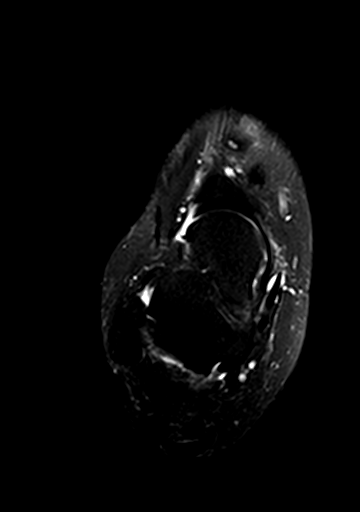
[im 19/24]
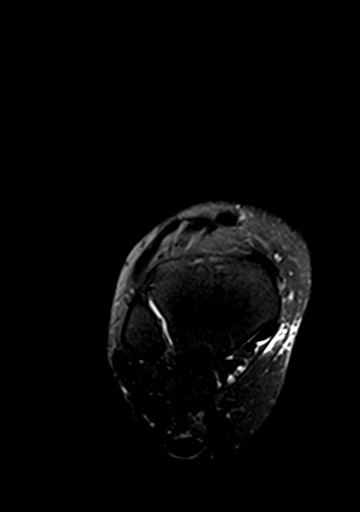
[im 24/24]
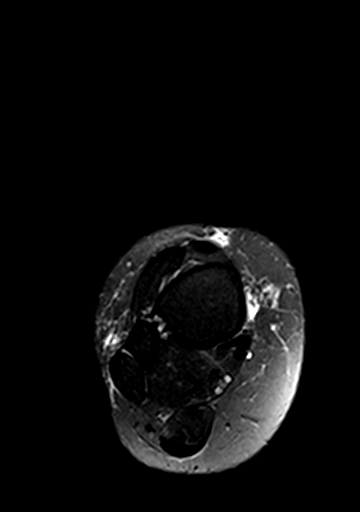

[Series 6: T2 fat-sat · sagittal · 3.0mm · 0.31mm/px · 6 of 25 slices shown (2 of 3)]
[im 1/25]
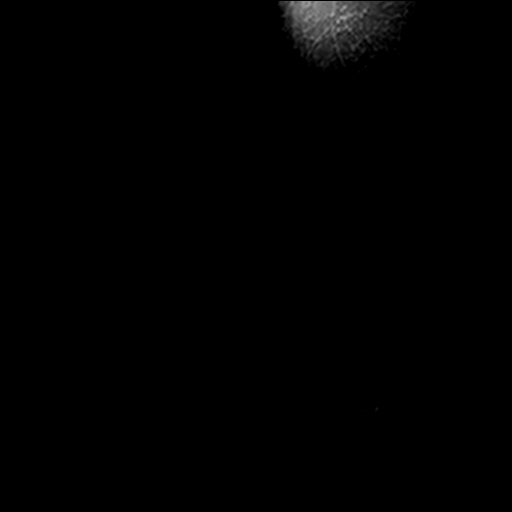
[im 5/25]
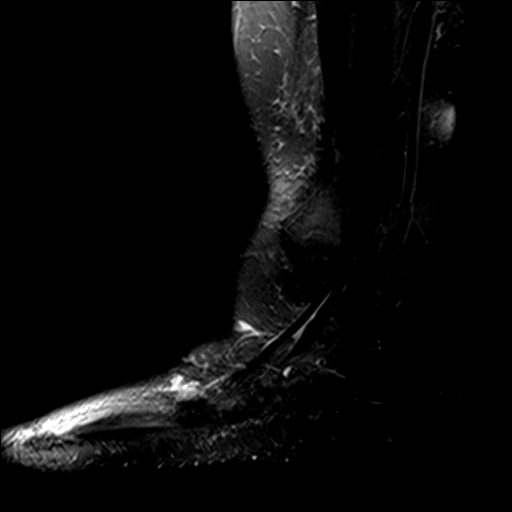
[im 10/25]
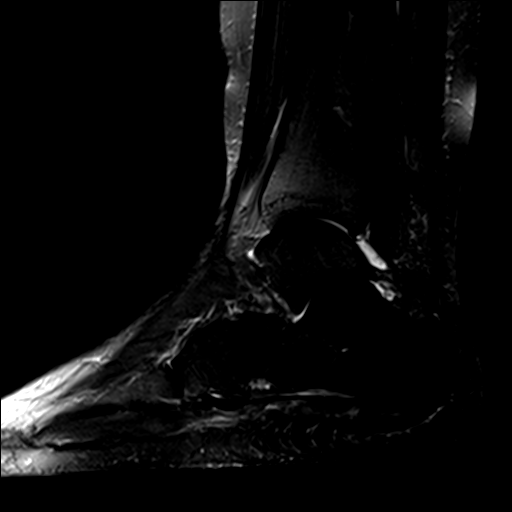
[im 15/25]
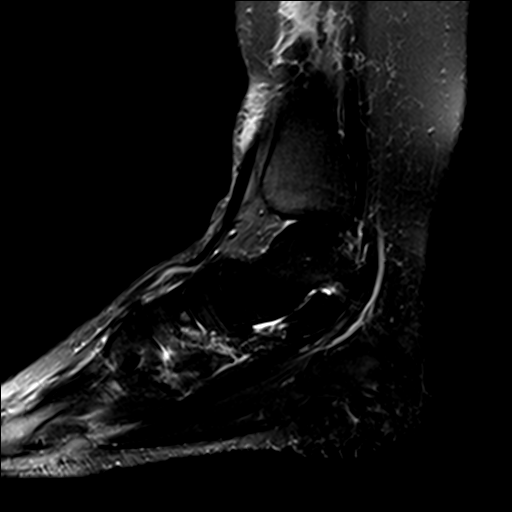
[im 20/25]
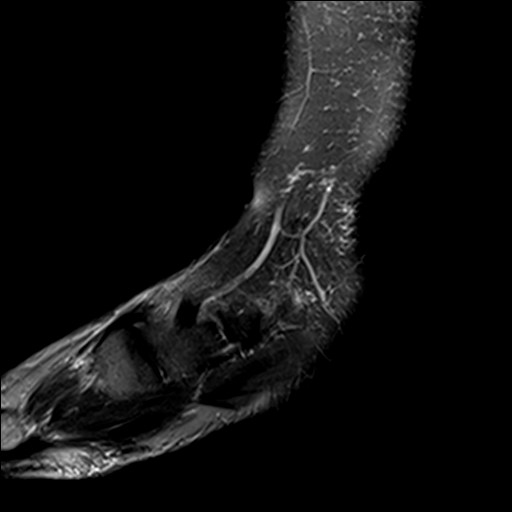
[im 25/25]
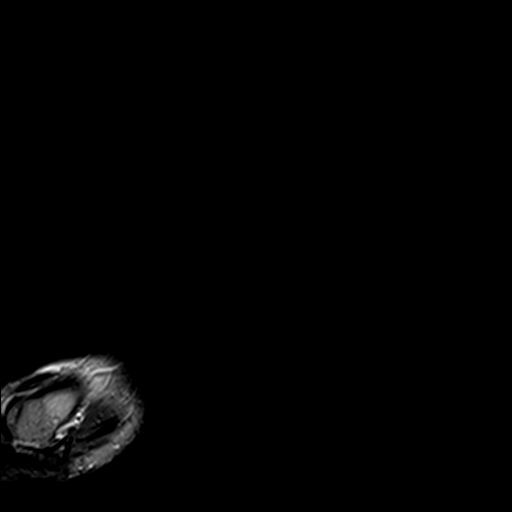

[Series 7: T2 fat-sat · coronal · 4.0mm · 0.31mm/px · 7 of 30 slices shown (3 of 3)]
[im 1/30]
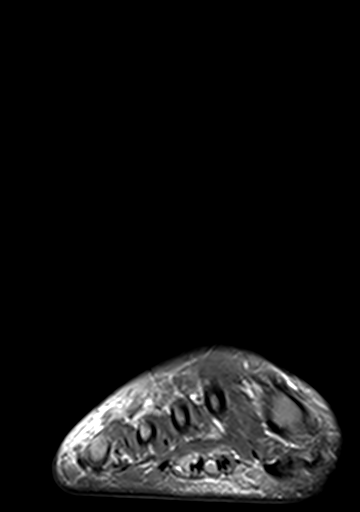
[im 5/30]
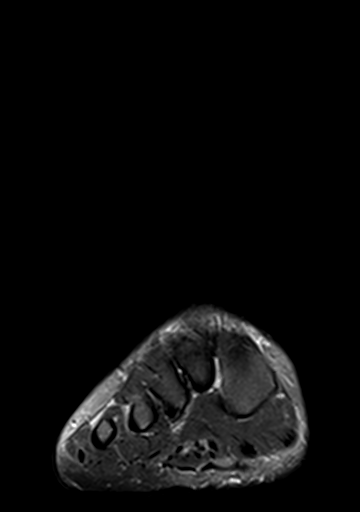
[im 9/30]
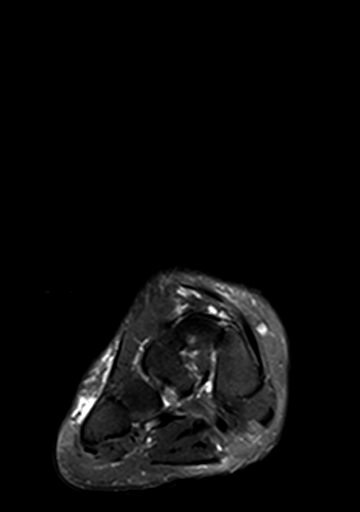
[im 13/30]
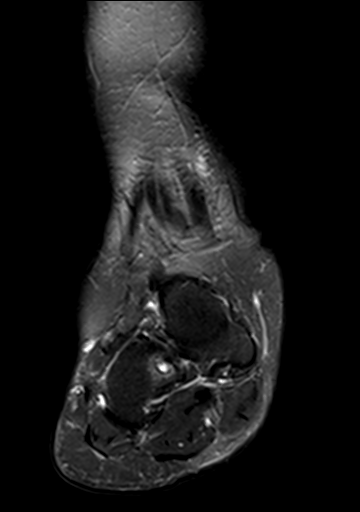
[im 17/30]
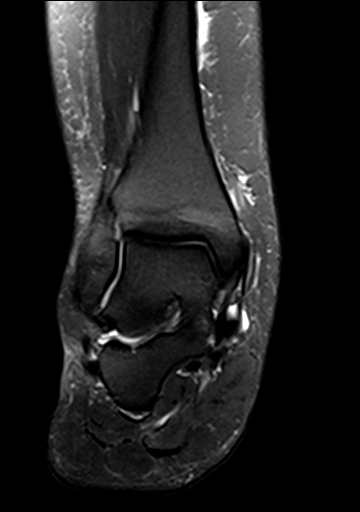
[im 21/30]
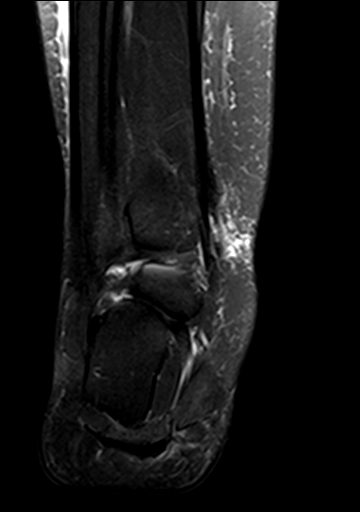
[im 25/30]
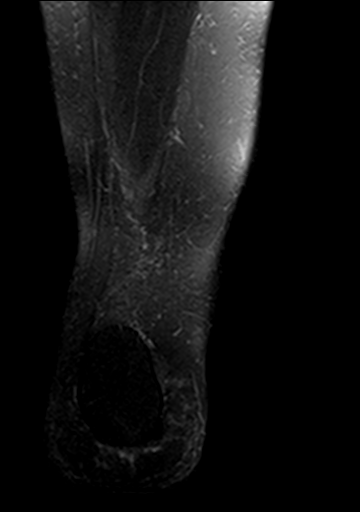

[25 of 40 positions shown; findings below may reference images not displayed]

FINDINGS: TENDONS

Peroneal: Normal.

Posteromedial: Minimal fluid in the posterior tibialis tendon sheath
adjacent to the talus. Otherwise normal.

Anterior: Normal.

Achilles: Normal.

Plantar Fascia: Minimal edema in the calcaneus at the origin of the
medial band of the plantar fascia.

LIGAMENTS

Lateral: The anterior talofibular ligament is poorly defined and
diminutive consistent with prior injury. Calcaneofibular ligament is
thickened. Posterior talofibular ligament appears normal.

Medial: Normal.

CARTILAGE

Ankle Joint: No joint effusion or chondral defect.

Subtalar Joints/Sinus Tarsi: No joint effusion or chondral defect.

Bones: Focal arthritic changes at the inner aspect of the calcaneal
cuboid joint with a 5 mm subcortical cyst in the cuboid with
subcortical edema in the cuboid.

Slight dorsal spurring at the talonavicular joint.

Other: None
IMPRESSION: 1. Arthritic changes at the inner aspect of the calcaneocuboid
joint.
2. Poor definition of the anterior talofibular ligament consistent
with prior injury.
3. Minimal tenosynovitis of posterior tibialis tendon.

## 2020-07-30 ENCOUNTER — Encounter (INDEPENDENT_AMBULATORY_CARE_PROVIDER_SITE_OTHER): Payer: Self-pay

## 2020-10-20 ENCOUNTER — Ambulatory Visit: Payer: Self-pay

## 2021-11-18 ENCOUNTER — Encounter (INDEPENDENT_AMBULATORY_CARE_PROVIDER_SITE_OTHER): Payer: Self-pay

## 2022-03-25 ENCOUNTER — Encounter: Payer: Self-pay | Admitting: Emergency Medicine

## 2022-03-25 ENCOUNTER — Ambulatory Visit
Admission: EM | Admit: 2022-03-25 | Discharge: 2022-03-25 | Disposition: A | Discharge status: Home / Self Care | Payer: Commercial Managed Care - PPO | Attending: Physician Assistant | Admitting: Physician Assistant

## 2022-03-25 ENCOUNTER — Other Ambulatory Visit: Payer: Self-pay

## 2022-03-25 DIAGNOSIS — J069 Acute upper respiratory infection, unspecified: Secondary | ICD-10-CM | POA: Diagnosis present

## 2022-03-25 DIAGNOSIS — Z20828 Contact with and (suspected) exposure to other viral communicable diseases: Secondary | ICD-10-CM | POA: Diagnosis not present

## 2022-03-25 DIAGNOSIS — Z1152 Encounter for screening for COVID-19: Secondary | ICD-10-CM | POA: Diagnosis not present

## 2022-03-25 MED ORDER — OSELTAMIVIR PHOSPHATE 75 MG PO CAPS: 75.0000 mg | ORAL_CAPSULE | Freq: Two times a day (BID) | ORAL | 0 refills | Status: AC

## 2022-03-25 NOTE — ED Triage Notes (Signed)
Pt here for body aches, diarrhea, nausea and fatigue; pt sts daughter was positive for flu on Monday

## 2022-03-25 NOTE — ED Provider Notes (Signed)
[facility]    CSN: [id number] Arrival date & time: 03/25/22  1709      History   Chief Complaint Chief Complaint  Patient presents with   Diarrhea   Generalized Body Aches    HPI Cheryl Luna is a 46 y.o. female.   Patient here today for evaluation of body aches, diarrhea, nausea and fatigue that started yesterday.  She reports that her daughter tested positive for flu at the beginning of the week.  She has not had any fever that she is aware of.  She has had some congestion and cough.  She denies any sore throat.  She has tried taking over-the-counter medication with mild relief.  The history is provided by the patient.  Diarrhea Associated symptoms: myalgias   Associated symptoms: no abdominal pain, no chills, no fever and no vomiting     Past Medical History:  Diagnosis Date   Hypertension    Thyroid disease     There are no problems to display for this patient.   History reviewed. No pertinent surgical history.  OB History   No obstetric history on file.      Home Medications    Prior to Admission medications   Medication Sig Start Date End Date Taking? Authorizing Provider  oseltamivir (TAMIFLU) 75 MG capsule Take 1 capsule (75 mg total) by mouth every 12 (twelve) hours. 03/25/22  Yes Tomi Bamberger, PA-C  aspirin 81 MG tablet Take 81 mg by mouth daily.    [provider]  cyclobenzaprine (FLEXERIL) 10 MG tablet Take 1 tablet (10 mg total) by mouth 3 (three) times daily as needed for muscle spasms. 09/11/13   Gilda Crease, MD  metoprolol succinate (TOPROL-XL) 50 MG 24 hr tablet Take 50 mg by mouth daily. Take with or immediately following a meal.    [provider]  oxyCODONE-acetaminophen (PERCOCET) 5-325 MG per tablet Take 1-2 tablets by mouth every 4 (four) hours as needed. 09/11/13   Gilda Crease, MD  thyroid (ARMOUR) 30 MG tablet Take 30 mg by mouth daily before breakfast.    [provider]     Family History History reviewed. No pertinent family history.  Social History Social History   Tobacco Use   Smoking status: Never   Smokeless tobacco: Never  Substance Use Topics   Alcohol use: No   Drug use: No     Allergies   Bee venom, Codeine, and Other   Review of Systems Review of Systems  Constitutional:  Positive for fatigue. Negative for chills and fever.  HENT:  Positive for congestion. Negative for ear pain and sore throat.   Eyes:  Negative for discharge and redness.  Respiratory:  Positive for cough. Negative for shortness of breath and wheezing.   Gastrointestinal:  Positive for diarrhea and nausea. Negative for abdominal pain and vomiting.  Musculoskeletal:  Positive for myalgias.     Physical Exam Triage Vital Signs ED Triage Vitals [03/25/22 1856]  Enc Vitals Group     BP (!) 155/82     Pulse Rate 65     Resp 18     Temp 98.7 F (37.1 C)     Temp Source Oral     SpO2 96 %     Weight      Height      Head Circumference      Peak Flow      Pain Score 4     Pain Loc  Pain Edu?      Excl. in GC?    No data found.  Updated Vital Signs BP (!) 155/82 (BP Location: Left Arm)   Pulse 65   Temp 98.7 F (37.1 C) (Oral)   Resp 18   SpO2 96%     Physical Exam Vitals and nursing note reviewed.  Constitutional:      General: She is not in acute distress.    Appearance: Normal appearance. She is not ill-appearing.  HENT:     Head: Normocephalic and atraumatic.     Nose: Congestion present.     Mouth/Throat:     Mouth: Mucous membranes are moist.     Pharynx: No oropharyngeal exudate or posterior oropharyngeal erythema.  Eyes:     Conjunctiva/sclera: Conjunctivae normal.  Cardiovascular:     Rate and Rhythm: Normal rate and regular rhythm.     Heart sounds: Normal heart sounds. No murmur heard. Pulmonary:     Effort: Pulmonary effort is normal. No respiratory distress.     Breath sounds: Normal breath sounds. No wheezing,  rhonchi or rales.  Skin:    General: Skin is warm and dry.  Neurological:     Mental Status: She is alert.  Psychiatric:        Mood and Affect: Mood normal.        Thought Content: Thought content normal.      UC Treatments / Results  Labs (all labs ordered are listed, but only abnormal results are displayed) Labs Reviewed  RESP PANEL BY RT-PCR (FLU A&B, COVID) [id number]    EKG   Radiology No results found.  Procedures Procedures (including critical care time)  Medications Ordered in UC Medications - No data to display  Initial Impression / Assessment and Plan / UC Course  I have reviewed the triage vital signs and the nursing notes.  Pertinent labs & imaging results that were available during my care of the patient were reviewed by me and considered in my medical decision making (see chart for details).    Will order COVID and flu screening but will treat to cover flu given known exposure.  Recommended further evaluation if symptoms fail to improve or worsen.  Okay to continue symptomatic treatment if needed.  Final Clinical Impressions(s) / UC Diagnoses   Final diagnoses:  Acute upper respiratory infection  Exposure to influenza  Encounter for screening for COVID-19   Discharge Instructions   None    ED Prescriptions     Medication Sig Dispense Auth. Provider   oseltamivir (TAMIFLU) 75 MG capsule Take 1 capsule (75 mg total) by mouth every 12 (twelve) hours. 10 capsule Tomi Bamberger, PA-C      PDMP not reviewed this encounter.   Tomi Bamberger, PA-C 03/25/22 1936

## 2022-03-26 LAB — RESP PANEL BY RT-PCR (FLU A&B, COVID) ARPGX2
Influenza A by PCR: NEGATIVE
Influenza B by PCR: NEGATIVE
SARS Coronavirus 2 by RT PCR: NEGATIVE

## 2022-04-21 ENCOUNTER — Other Ambulatory Visit: Payer: Self-pay | Admitting: Family Medicine

## 2022-04-21 ENCOUNTER — Ambulatory Visit
Admission: RE | Admit: 2022-04-21 | Discharge: 2022-04-21 | Disposition: A | Discharge status: Home / Self Care | Payer: 59 | Source: Ambulatory Visit | Attending: Family Medicine | Admitting: Family Medicine

## 2022-04-21 DIAGNOSIS — R052 Subacute cough: Secondary | ICD-10-CM
